# Patient Record
Sex: Male | Born: 1965 | Race: Black or African American | Hispanic: No | Marital: Single | Smoking: Current some day smoker
Health system: Southern US, Community
[De-identification: ages and names within clinical notes are randomized; demographics above are authoritative.]

---

## 2011-09-09 ENCOUNTER — Encounter (HOSPITAL_COMMUNITY): Payer: Self-pay | Admitting: *Deleted

## 2011-09-09 ENCOUNTER — Emergency Department (HOSPITAL_COMMUNITY)
Admission: EM | Admit: 2011-09-09 | Discharge: 2011-09-09 | Disposition: A | Discharge status: Home / Self Care | Payer: Self-pay | Attending: Emergency Medicine | Admitting: Emergency Medicine

## 2011-09-09 DIAGNOSIS — H60399 Other infective otitis externa, unspecified ear: Secondary | ICD-10-CM | POA: Insufficient documentation

## 2011-09-09 DIAGNOSIS — H6090 Unspecified otitis externa, unspecified ear: Secondary | ICD-10-CM

## 2011-09-09 MED ORDER — NEOMYCIN-POLYMYXIN-HC 3.5-10000-1 OT SUSP: 4.0000 [drp] | Freq: Three times a day (TID) | OTIC | Status: AC

## 2011-09-09 NOTE — ED Provider Notes (Signed)
Evaluation and management procedures were performed by the PA/NP/CNM under my supervision/collaboration. I discussed the patient with the PA/NP/CNM and agree with the plan as documented    Chrystine Oiler, MD 09/09/11 1752

## 2011-09-09 NOTE — ED Notes (Signed)
Pt reports that he has had the feeling of something in his right ear for the last year.  Pt denies any other symptoms.  Pt states that it feels like there is a worm in there.  NAD at this time.

## 2011-09-09 NOTE — ED Provider Notes (Signed)
History     CSN: [id number]  Arrival date & time 09/09/11  1415   First MD Initiated Contact with Patient 09/09/11 1503      Chief Complaint  Patient presents with  . Otalgia    (Consider location/radiation/quality/duration/timing/severity/associated sxs/prior treatment) HPI Comments: Patient reports he has felt like there is something in his right ear for the past year.  States that approximately 3-4 times a day he feels something "wiggling" in his ear like a worm.  Has tried many home remedies for this including various oils as well as bulb suction without relief.  States that when he uses the suction it feels like the "worm" is trying to get away.  Notes that occasionally he feels like there is something crawling within his pinna on the same side.  He feels the crawling sensation occasionally move down below his ear towards his neck, then come back to his ear canal, as if "coming up for air."  Admits to possible decrease in hearing with is right ear.  Denies fevers, sore throat, ear discharge.  States he does have dental problems and is slowly losing his molars, pulling them out himself but denies any current problems.    Patient is a 46 y.o. male presenting with ear pain. The history is provided by the patient.  Otalgia Associated symptoms include hearing loss. Pertinent negatives include no ear discharge and no sore throat.    History reviewed. No pertinent past medical history.  History reviewed. No pertinent past surgical history.  History reviewed. No pertinent family history.  History  Substance Use Topics  . Smoking status: Not on file  . Smokeless tobacco: Not on file  . Alcohol Use: Not on file      Review of Systems  Constitutional: Negative for fever and chills.  HENT: Positive for hearing loss, ear pain and dental problem. Negative for sore throat, mouth sores, trouble swallowing, neck stiffness, tinnitus and ear discharge.     Allergies  Review of patient's  allergies indicates no known allergies.  Home Medications  No current outpatient prescriptions on file.  BP 132/96  Pulse 91  Temp 99.3 F (37.4 C)  Resp 16  Wt 146 lb 1.6 oz (66.271 kg)  SpO2 99%  Physical Exam  Nursing note and vitals reviewed. Constitutional: He appears well-developed and well-nourished. No distress.  HENT:  Head: Normocephalic and atraumatic.  Right Ear: Tympanic membrane and external ear normal.  Left Ear: Tympanic membrane, external ear and ear canal normal.  Mouth/Throat: Oropharynx is clear and moist. No oropharyngeal exudate.       Right canal mildly erythematous with tiny pieces of paper-appearing foreign body adhered to TM.  No tenderness with pressure of tragus or traction of pinna.  No FB seen.    Eyes: Conjunctivae are normal.  Neck: Neck supple. No tracheal deviation present.  Pulmonary/Chest: Effort normal.  Lymphadenopathy:    He has no cervical adenopathy.  Neurological: He is alert.  Skin: He is not diaphoretic.    ED Course  Procedures (including critical care time)  Labs Reviewed - No data to display No results found.   1. Otitis externa       MDM  Pt with FB/movement sensation within right ear canal x 1 year.  No FB seen on exam, possible mild case of otitis externa.  TMs are normal.  Pt d/c home with cortisporin otic and PCP resources for follow up.  Return precautions given.  Pt verbalizes understanding and agrees with  plan.          Trixie Dredge, PA 09/09/11 1624

## 2014-04-12 ENCOUNTER — Emergency Department (HOSPITAL_COMMUNITY): Payer: Self-pay

## 2014-04-12 ENCOUNTER — Emergency Department (HOSPITAL_COMMUNITY)
Admission: EM | Admit: 2014-04-12 | Discharge: 2014-04-12 | Disposition: A | Discharge status: Home / Self Care | Payer: Self-pay | Attending: Emergency Medicine | Admitting: Emergency Medicine

## 2014-04-12 ENCOUNTER — Encounter (HOSPITAL_COMMUNITY): Payer: Self-pay | Admitting: Emergency Medicine

## 2014-04-12 DIAGNOSIS — R079 Chest pain, unspecified: Secondary | ICD-10-CM | POA: Insufficient documentation

## 2014-04-12 DIAGNOSIS — Z720 Tobacco use: Secondary | ICD-10-CM | POA: Insufficient documentation

## 2014-04-12 LAB — BASIC METABOLIC PANEL
Anion gap: 10 (ref 5–15)
BUN: 9 mg/dL (ref 6–23)
CALCIUM: 9.4 mg/dL (ref 8.4–10.5)
CHLORIDE: 104 mmol/L (ref 96–112)
CO2: 25 mmol/L (ref 19–32)
Creatinine, Ser: 1.02 mg/dL (ref 0.50–1.35)
GFR, EST NON AFRICAN AMERICAN: 85 mL/min — AB (ref >90)
Glucose, Bld: 100 mg/dL — ABNORMAL HIGH (ref 70–99)
Potassium: 4.1 mmol/L (ref 3.5–5.1)
Sodium: 139 mmol/L (ref 135–145)

## 2014-04-12 LAB — CBC
HCT: 42.3 % (ref 39.0–52.0)
HEMOGLOBIN: 14.5 g/dL (ref 13.0–17.0)
MCH: 30.4 pg (ref 26.0–34.0)
MCHC: 34.3 g/dL (ref 30.0–36.0)
MCV: 88.7 fL (ref 78.0–100.0)
PLATELETS: 270 10*3/uL (ref 150–400)
RBC: 4.77 MIL/uL (ref 4.22–5.81)
RDW: 13.1 % (ref 11.5–15.5)
WBC: 5.4 10*3/uL (ref 4.0–10.5)

## 2014-04-12 LAB — TROPONIN I

## 2014-04-12 NOTE — ED Notes (Signed)
Patient is from Home. Woke up this morning with pain to Lt shoulder radiating to left chest. EMS states patient states it happens yearly. Vitals" 136/82 HR80 [id number]

## 2014-04-12 NOTE — ED Notes (Signed)
MD at bedside,

## 2014-04-12 NOTE — ED Provider Notes (Signed)
CSN: [id number]     Arrival date & time 04/12/14  1512 History   First MD Initiated Contact with Patient 04/12/14 1522     Chief Complaint  Patient presents with  . Chest Pain     (Consider location/radiation/quality/duration/timing/severity/associated sxs/prior Treatment) HPI  Pt presenting with c/o chest pain.  He states he has had similar pain in the past.  He states he awoke with the pain and it has been constant.  Pain is sharp and is in left chest and left shoulder blade.  No shortness of breath, no nausea or diaphoresis.  No leg swelling, no hx of DVT/PE.  No fever/chills.  No cough.  He states that he has had similar symptoms yearly in the past and attributes [place]  Pt states he would not have come to the ED but his family was worried and called EMS.  There are no other associated systemic symptoms, there are no other alleviating or modifying factors.   History reviewed. No pertinent past medical history. History reviewed. No pertinent past surgical history. No family history on file. History  Substance Use Topics  . Smoking status: Current Some Day Smoker  . Smokeless tobacco: Not on file  . Alcohol Use: Yes    Review of Systems  ROS reviewed and all otherwise negative except for mentioned in HPI    Allergies  Review of patient's allergies indicates no known allergies.  Home Medications   Prior to Admission medications   Not on File   BP 124/78 mmHg  Pulse 57  Temp(Src) 98.2 F (36.8 C) (Oral)  Resp 22  SpO2 100%  Vitals reviewed Physical Exam  Physical Examination: General appearance - alert, well appearing, and in no distress Mental status - alert, oriented to person, place, and time Mouth - mucous membranes moist, pharynx normal without lesions Chest - clear to auscultation, no wheezes, rales or rhonchi, symmetric air entry, no tenderness to palpation Heart - normal rate, regular rhythm, normal S1, S2,  no murmurs, rubs, clicks or gallops Abdomen - soft, nontender, nondistended, no masses or organomegaly Extremities - peripheral pulses normal, no pedal edema, no clubbing or cyanosis Skin - normal coloration and turgor, no rashes  ED Course  Procedures (including critical care time)  3:59 PM went to see patient, he is in xray Labs Review Labs Reviewed  BASIC METABOLIC PANEL - Abnormal; Notable for the following:    Glucose, Bld 100 (*)    GFR calc non Af Amer 85 (*)    All other components within normal limits  CBC  TROPONIN I    Imaging Review Dg Chest 2 View  04/12/2014   CLINICAL DATA:  Left-sided chest pain  EXAM: CHEST  2 VIEW  COMPARISON:  None.  FINDINGS: The heart size and mediastinal contours are within normal limits. Both lungs are clear. The visualized skeletal structures are unremarkable.  IMPRESSION: No active cardiopulmonary disease.   Electronically Signed   By: Christiana Pellant M.D.   On: 04/12/2014 16:09     EKG Interpretation   Date/Time:  Sunday April 12 2014 15:20:39 EDT Ventricular Rate:  75 PR Interval:  142 QRS Duration: 91 QT Interval:  391 QTC Calculation: 437 R Axis:   83 Text Interpretation:  Sinus rhythm Right atrial enlargement early  repolarization No old tracing to compare Confirmed by [facility]  MD, Dymon Summerhill  [phone]) on 04/12/2014 5:48:51 PM      MDM   Final diagnoses:  Chest pain, unspecified chest pain type    Pt presenting with c/o chest pain.  He states the pain has been constant since awakening this morning- at least > 8 hours at the time of ED evaluation.  He has no risk factors, PERC 0 so very low risk of PE.  Heart score of 3, low risk of MACE.  Advised followup with PMD.  Discharged with strict return precautions.  Pt agreeable with plan.    Jerelyn ScottMartha Linker, MD 04/12/14 [phone]

## 2014-04-12 NOTE — Discharge Instructions (Signed)
Return to the ED with any concerns including difficulty breathing, fainting, worsening pain, vomiting, decreased level of alertness/lethargy, or any other alarming symptoms °

## 2016-10-29 IMAGING — CR DG CHEST 2V
2 series · 2 of 2 positions shown · non-contrast
Comparison: None.

CLINICAL DATA: Left-sided chest pain

EXAM:
CHEST  2 VIEW

[chest pa]
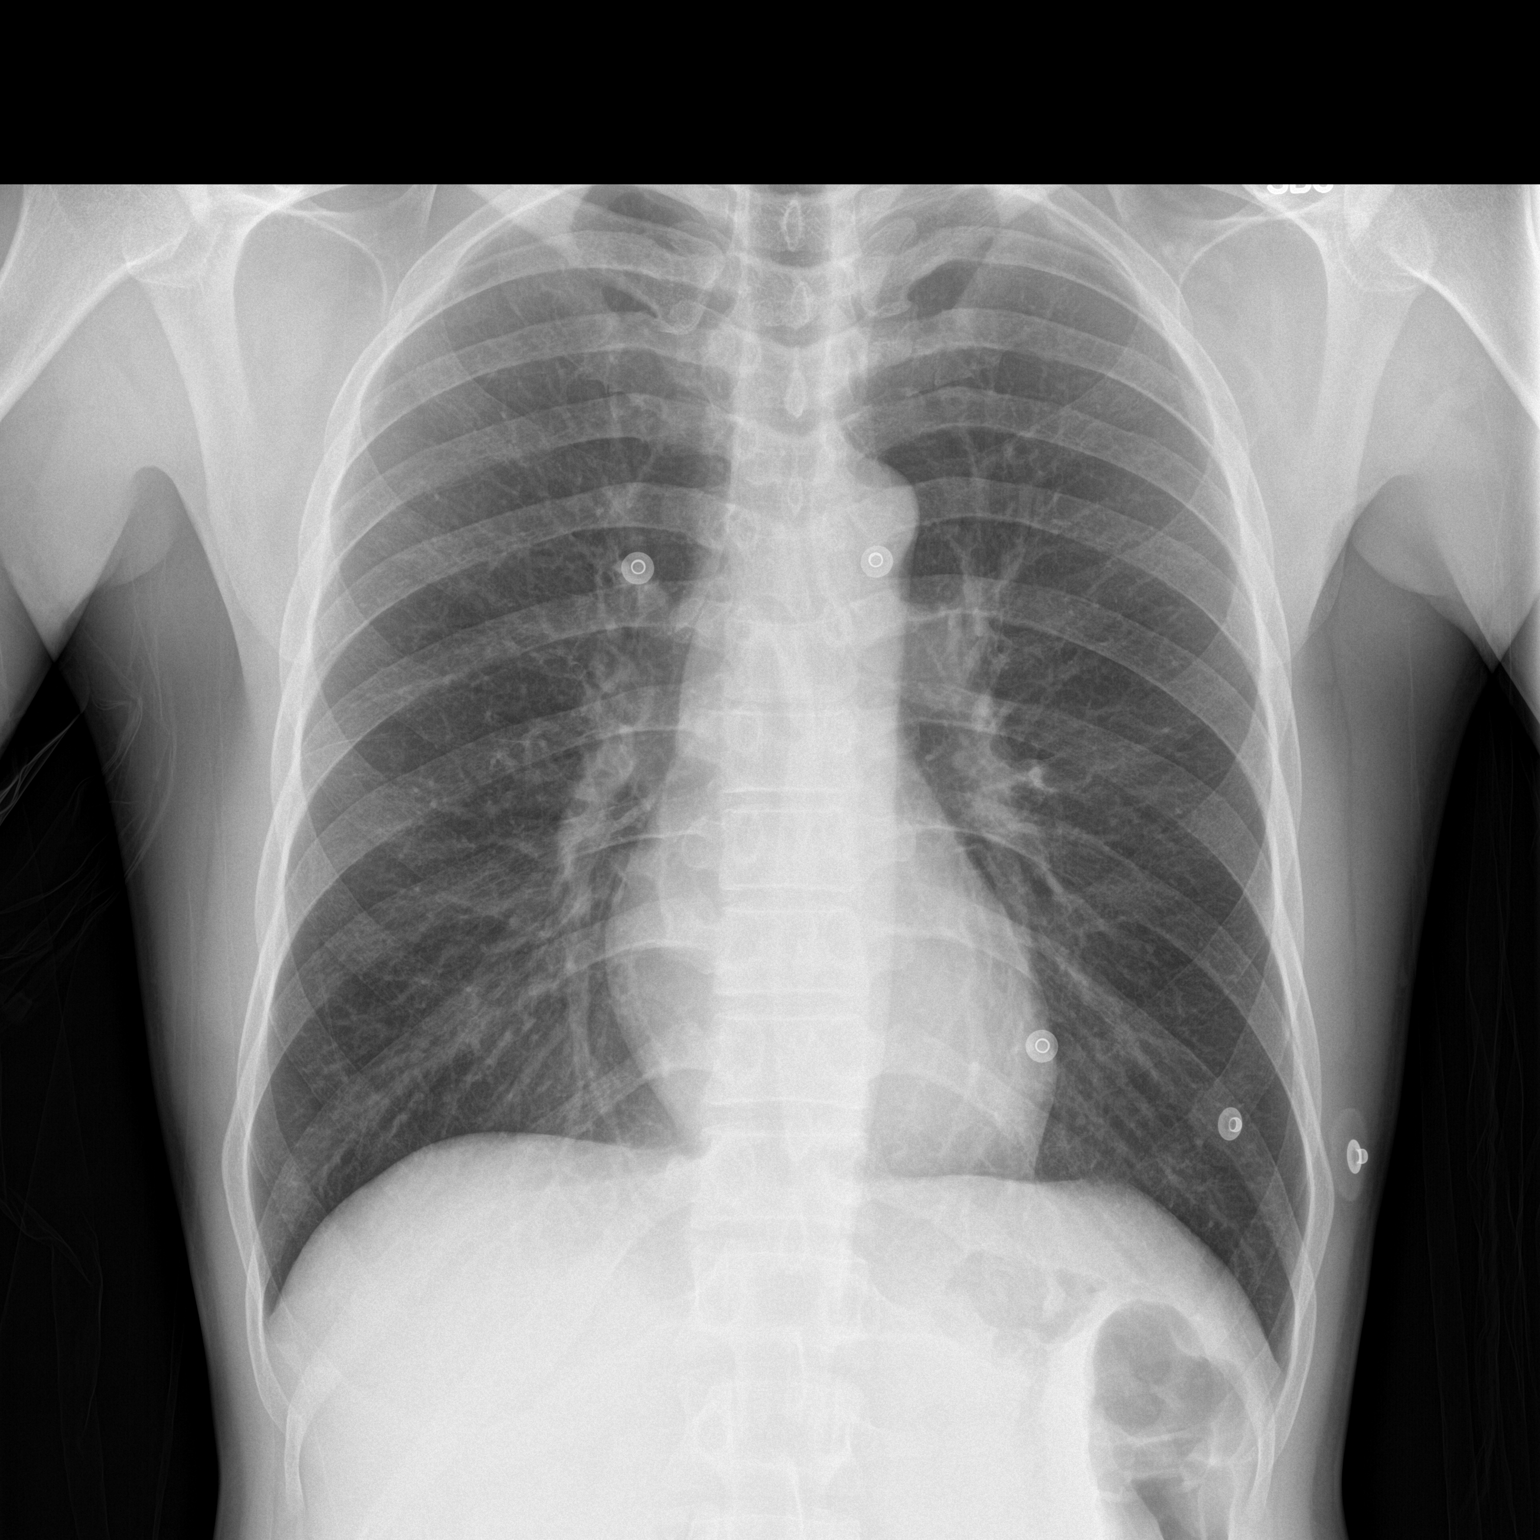

[chest lat]
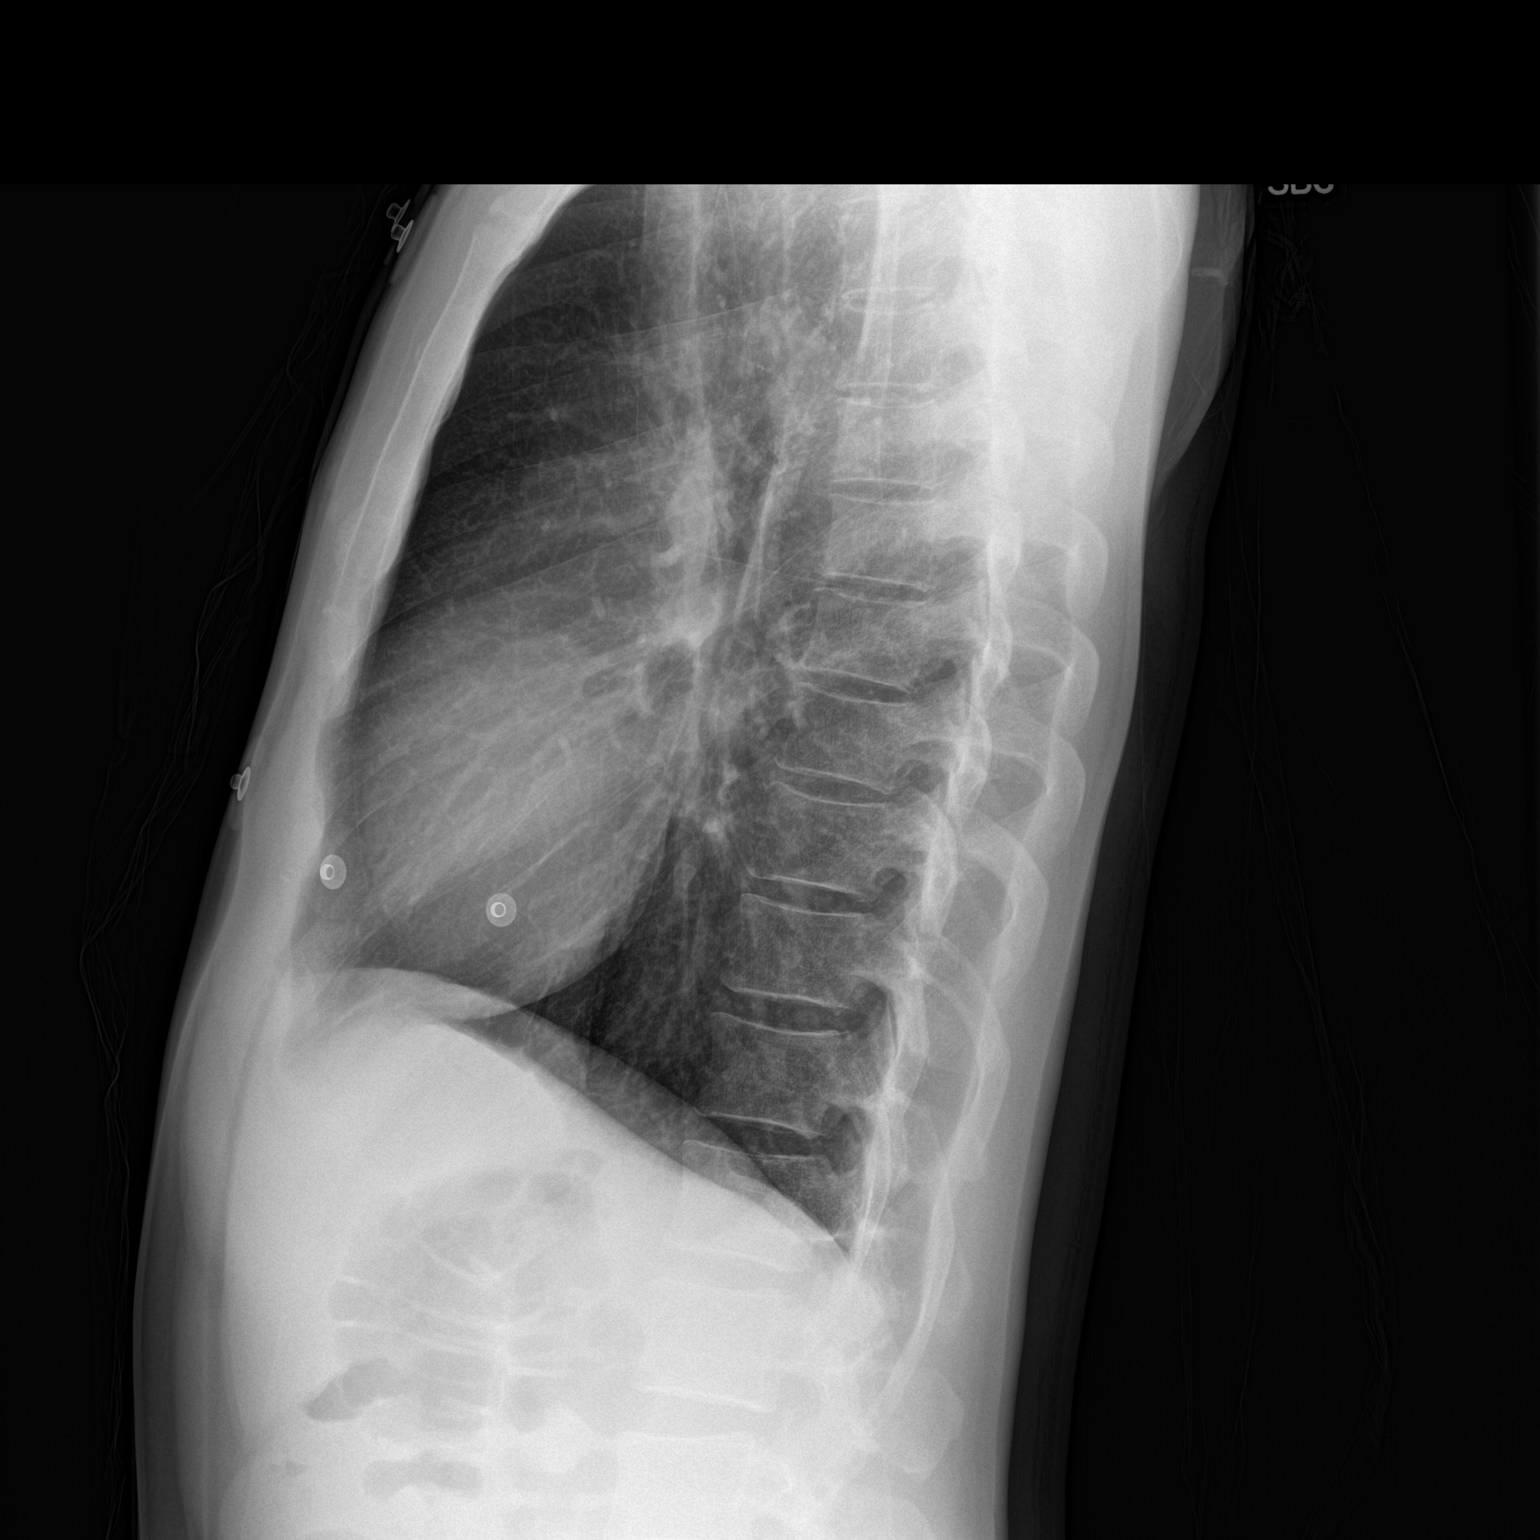

[2 of 2 positions shown; findings below may reference images not displayed]

FINDINGS: The heart size and mediastinal contours are within normal limits.
Both lungs are clear. The visualized skeletal structures are
unremarkable.
IMPRESSION: No active cardiopulmonary disease.
